# Patient Record
Sex: Female | Born: 1995 | Hispanic: Yes | Marital: Single | Smoking: Never smoker
Health system: Southern US, Community
[De-identification: ages and names within clinical notes are randomized; demographics above are authoritative.]

## PROBLEM LIST (undated history)

## (undated) ENCOUNTER — Inpatient Hospital Stay (HOSPITAL_COMMUNITY): Payer: Self-pay

## (undated) DIAGNOSIS — Z789 Other specified health status: Secondary | ICD-10-CM

## (undated) HISTORY — PX: NO PAST SURGERIES: SHX2092

---
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
## 2014-01-03 ENCOUNTER — Other Ambulatory Visit (HOSPITAL_COMMUNITY): Payer: Self-pay | Admitting: Nurse Practitioner

## 2014-01-03 DIAGNOSIS — Z3689 Encounter for other specified antenatal screening: Secondary | ICD-10-CM

## 2014-01-10 ENCOUNTER — Inpatient Hospital Stay (HOSPITAL_COMMUNITY)
Admission: AD | Admit: 2014-01-10 | Discharge: 2014-01-10 | Disposition: A | Discharge status: Home / Self Care | Payer: Medicaid Other | Source: Ambulatory Visit | Attending: Obstetrics & Gynecology | Admitting: Obstetrics & Gynecology

## 2014-01-10 ENCOUNTER — Encounter (HOSPITAL_COMMUNITY): Payer: Self-pay | Admitting: *Deleted

## 2014-01-10 DIAGNOSIS — R51 Headache: Secondary | ICD-10-CM | POA: Insufficient documentation

## 2014-01-10 DIAGNOSIS — O210 Mild hyperemesis gravidarum: Secondary | ICD-10-CM | POA: Insufficient documentation

## 2014-01-10 DIAGNOSIS — R109 Unspecified abdominal pain: Secondary | ICD-10-CM | POA: Insufficient documentation

## 2014-01-10 DIAGNOSIS — O219 Vomiting of pregnancy, unspecified: Secondary | ICD-10-CM

## 2014-01-10 HISTORY — DX: Other specified health status: Z78.9

## 2014-01-10 MED ORDER — PROMETHAZINE HCL 25 MG/ML IJ SOLN
25.0000 mg | Freq: Once | INTRAMUSCULAR | Status: AC
  Administered 2014-01-10: 25 mg via INTRAVENOUS
  Filled 2014-01-10: qty 1

## 2014-01-10 MED ORDER — PROMETHAZINE HCL 25 MG PO TABS: 25.0000 mg | ORAL_TABLET | Freq: Four times a day (QID) | ORAL | Status: DC | PRN

## 2014-01-10 NOTE — MAU Note (Signed)
C/O nausea, vomiting x 2 days.  Unable to keep anything down.  [facility]

## 2014-01-10 NOTE — MAU Provider Note (Signed)
History     CSN: [id number]  Arrival date and time: 01/10/14 1702   First Provider Initiated Contact with Patient 01/10/14 1833      Chief Complaint  Patient presents with  . Nausea  . Emesis During Pregnancy   HPI Ms. Sherri Kane is a 18 y.o. G2P1001 at 3429w3d who presents to MAU today with complaint of N/V. The patient states that she has had N/V throughout the pregnancy and has been taking B6 and Unisom. She states that N/V has become worse x 3 days. She has had little PO intake x 2 days. She has headache. She denies fever, diarrhea, UTI symptoms or vaginal bleeding. She does have some mild mid abdominal pain.   OB History   Grav Para Term Preterm Abortions TAB SAB Ect Mult Living   2 1 1       1       Past Medical History  Diagnosis Date  . Medical history non-contributory     Past Surgical History  Procedure Laterality Date  . No past surgeries      History reviewed. No pertinent family history.  History  Substance Use Topics  . Smoking status: Never Smoker   . Smokeless tobacco: Not on file  . Alcohol Use: No    Allergies: No Known Allergies  Prescriptions prior to admission  Medication Sig Dispense Refill  . doxylamine, Sleep, (UNISOM) 25 MG tablet Take 25 mg by mouth at bedtime as needed (nausea).      . Prenatal Vit-Fe Fumarate-FA (PRENATAL MULTIVITAMIN) TABS tablet Take 1 tablet by mouth daily at 12 noon.      . Pyridoxine HCl (B-6 PO) Take 1 tablet by mouth daily.        Review of Systems  Constitutional: Negative for fever and malaise/fatigue.  Gastrointestinal: Positive for nausea, vomiting and abdominal pain. Negative for diarrhea and constipation.  Genitourinary: Negative for dysuria, urgency and frequency.       Neg - vaginal bleeding  Neurological: Positive for headaches.   Physical Exam   Blood pressure 106/66, pulse 79, temperature 98.4 F (36.9 C), temperature source Oral, resp. rate 18, height 4\' 8"  (1.422 m), weight 136 lb  6.4 oz (61.871 kg), last menstrual period 10/08/2013.  Physical Exam  Constitutional: She is oriented to person, place, and time. She appears well-developed and well-nourished. No distress.  HENT:  Head: Normocephalic and atraumatic.  Cardiovascular: Normal rate, regular rhythm and normal heart sounds.   Respiratory: Effort normal and breath sounds normal. No respiratory distress.  GI: Soft. Bowel sounds are normal. She exhibits no distension and no mass. There is no tenderness. There is no rebound and no guarding.  Neurological: She is alert and oriented to person, place, and time.  Skin: Skin is warm and dry. No erythema.  Psychiatric: She has a normal mood and affect.    MAU Course  Procedures None  MDM FHR - 154 bpm with doppler Unable to given urine sample today 25 mg IV Phenergan in 1 liter D5LR ordered  Patient reports improvement in symptoms after IVF. No episodes of emesis while in MAU. Able to tolerate PO today.   Assessment and Plan  A: Nausea and vomiting in pregnancy prior to [redacted] week gestation  P: Discharge home Rx for Phenergan sent to patient's pharmacy Discussed increased PO hydration and advancing diet as tolerated Patient advised [facility] Patient may return to MAU as needed or if her  condition were to change or worsen   Freddi Starr, PA-C  01/10/2014, 8:31 PM

## 2014-01-10 NOTE — Discharge Instructions (Signed)
Hyperemesis Gravidarum Diet °Hyperemesis gravidarum is a severe form of morning sickness. It is characterized by frequent and severe vomiting. It happens during the first trimester of pregnancy. It may be caused by the rapid hormone changes that happen during pregnancy. It is associated with a 5% weight loss of pre-pregnancy weight. The hyperemesis diet may be used to lessen symptoms of nausea and vomiting. °EATING GUIDELINES °· Eat 5 to 6 small meals daily instead of 3 large meals. °· Avoid foods with strong smells. °· Avoid drinking 30 minutes before and after meals. °· Avoid fried or high-fat foods, such as butter and cream sauces. °· Starchy foods are usually well-tolerated, such as cereal, toast, bread, potatoes, pasta, rice, and pretzels. °· Eat crackers before you get out of bed in the morning. °· Avoid spicy foods. °· Ginger may help with nausea. Add ¼ tsp ginger to hot tea or choose ginger tea. °· Continue to take your prenatal vitamins as directed by your caregiver. °SAMPLE MEAL PLAN °Breakfast  °· ½ cup oatmeal °· 1 slice toast °· 1 tsp heart-healthy margarine °· 1 tsp jelly °· 1 scrambled egg °Midmorning Snack  °· 1 cup low-fat yogurt °Lunch  °· Plain ham sandwich °· Carrot or celery sticks °· 1 small apple °· 3 graham crackers °Midafternoon Snack  °· Cheese and crackers °Dinner °· 4 oz pork tenderloin °· 1 small baked potato °· 1 tsp margarine °· ½ cup broccoli °· ½ cup grapes °Evening Snack °· 1 cup pudding °Document Released: 07/28/2007 Document Revised: 12/23/2011 Document Reviewed: 03/02/2013 °ExitCare® Patient Information ©2014 [employer]. ° °Morning Sickness °Morning sickness is when you feel sick to your stomach (nauseous) during pregnancy. You may feel sick to your stomach and throw up (vomit). You may feel sick in the morning, but you can feel this way any time of day. Some women feel very sick to their stomach and cannot stop throwing up (hyperemesis gravidarum). °HOME CARE °· Only take  medicines as told by your doctor. °· Take multivitamins as told by your doctor. Taking multivitamins before getting pregnant can stop or lessen the harshness of morning sickness. °· Eat dry toast or unsalted crackers before getting out of bed. °· Eat 5 to 6 small meals a day. °· Eat dry and bland foods like rice and baked potatoes. °· Do not drink liquids with meals. Drink between meals. °· Do not eat greasy, fatty, or spicy foods. °· Have someone cook for you if the smell of food causes you to feel sick or throw up. °· If you feel sick to your stomach after taking prenatal vitamins, take them at night or with a snack. °· Eat protein when you need a snack (nuts, yogurt, cheese). °· Eat unsweetened gelatins for dessert. °· Wear a bracelet used for sea sickness (acupressure wristband). °· Go to a doctor that puts thin needles into certain body points (acupuncture) to improve how you feel. °· Do not smoke. °· Use a humidifier to keep the air in your house free of odors. °· Get lots of fresh air. °GET HELP IF: °· You need medicine to feel better. °· You feel dizzy or lightheaded. °· You are losing weight. °GET HELP RIGHT AWAY IF:  °· You feel very sick to your stomach and cannot stop throwing up. °· You pass out (faint). °Document Released: 11/07/2004 Document Revised: 06/02/2013 Document Reviewed: 03/17/2013 °ExitCare® Patient Information ©2014 [employer]. ° °

## 2014-01-11 ENCOUNTER — Encounter (HOSPITAL_COMMUNITY): Payer: Self-pay

## 2014-01-11 ENCOUNTER — Ambulatory Visit (HOSPITAL_COMMUNITY): Admission: RE | Admit: 2014-01-11 | Payer: Medicaid Other | Source: Ambulatory Visit

## 2014-01-11 ENCOUNTER — Other Ambulatory Visit (HOSPITAL_COMMUNITY): Payer: Self-pay | Admitting: Nurse Practitioner

## 2014-01-11 ENCOUNTER — Ambulatory Visit (HOSPITAL_COMMUNITY)
Admission: RE | Admit: 2014-01-11 | Discharge: 2014-01-11 | Disposition: A | Discharge status: Home / Self Care | Payer: Medicaid Other | Source: Ambulatory Visit | Attending: Nurse Practitioner | Admitting: Nurse Practitioner

## 2014-01-11 DIAGNOSIS — Z3682 Encounter for antenatal screening for nuchal translucency: Secondary | ICD-10-CM

## 2014-01-11 DIAGNOSIS — O351XX0 Maternal care for (suspected) chromosomal abnormality in fetus, not applicable or unspecified: Secondary | ICD-10-CM | POA: Insufficient documentation

## 2014-01-11 DIAGNOSIS — Z3689 Encounter for other specified antenatal screening: Secondary | ICD-10-CM | POA: Insufficient documentation

## 2014-01-11 DIAGNOSIS — O3510X0 Maternal care for (suspected) chromosomal abnormality in fetus, unspecified, not applicable or unspecified: Secondary | ICD-10-CM | POA: Insufficient documentation

## 2014-02-21 ENCOUNTER — Ambulatory Visit (HOSPITAL_COMMUNITY)
Admission: RE | Admit: 2014-02-21 | Discharge: 2014-02-21 | Disposition: A | Discharge status: Home / Self Care | Payer: Medicaid Other | Source: Ambulatory Visit | Attending: Nurse Practitioner | Admitting: Nurse Practitioner

## 2014-02-21 DIAGNOSIS — Z3689 Encounter for other specified antenatal screening: Secondary | ICD-10-CM | POA: Insufficient documentation

## 2014-04-22 ENCOUNTER — Other Ambulatory Visit (HOSPITAL_COMMUNITY): Payer: Self-pay | Admitting: Nurse Practitioner

## 2014-04-22 DIAGNOSIS — Z0489 Encounter for examination and observation for other specified reasons: Secondary | ICD-10-CM

## 2014-04-22 DIAGNOSIS — IMO0002 Reserved for concepts with insufficient information to code with codable children: Secondary | ICD-10-CM

## 2014-04-29 ENCOUNTER — Ambulatory Visit (HOSPITAL_COMMUNITY)
Admission: RE | Admit: 2014-04-29 | Discharge: 2014-04-29 | Disposition: A | Discharge status: Home / Self Care | Payer: Medicaid Other | Source: Ambulatory Visit | Attending: Nurse Practitioner | Admitting: Nurse Practitioner

## 2014-04-29 DIAGNOSIS — Z363 Encounter for antenatal screening for malformations: Secondary | ICD-10-CM | POA: Insufficient documentation

## 2014-04-29 DIAGNOSIS — IMO0002 Reserved for concepts with insufficient information to code with codable children: Secondary | ICD-10-CM

## 2014-04-29 DIAGNOSIS — Z0489 Encounter for examination and observation for other specified reasons: Secondary | ICD-10-CM

## 2014-04-29 DIAGNOSIS — Z1389 Encounter for screening for other disorder: Secondary | ICD-10-CM | POA: Diagnosis not present

## 2014-06-08 ENCOUNTER — Encounter (HOSPITAL_COMMUNITY): Payer: Self-pay

## 2014-06-08 ENCOUNTER — Inpatient Hospital Stay (HOSPITAL_COMMUNITY)
Admission: AD | Admit: 2014-06-08 | Discharge: 2014-06-08 | Disposition: A | Discharge status: Home / Self Care | Payer: Medicaid Other | Source: Ambulatory Visit | Attending: Obstetrics and Gynecology | Admitting: Obstetrics and Gynecology

## 2014-06-08 ENCOUNTER — Other Ambulatory Visit: Payer: Self-pay | Admitting: Family Medicine

## 2014-06-08 DIAGNOSIS — O4700 False labor before 37 completed weeks of gestation, unspecified trimester: Secondary | ICD-10-CM | POA: Insufficient documentation

## 2014-06-08 DIAGNOSIS — O479 False labor, unspecified: Secondary | ICD-10-CM

## 2014-06-08 DIAGNOSIS — N3001 Acute cystitis with hematuria: Secondary | ICD-10-CM

## 2014-06-08 DIAGNOSIS — M549 Dorsalgia, unspecified: Secondary | ICD-10-CM | POA: Diagnosis present

## 2014-06-08 DIAGNOSIS — N390 Urinary tract infection, site not specified: Secondary | ICD-10-CM

## 2014-06-08 LAB — URINALYSIS, ROUTINE W REFLEX MICROSCOPIC
Bilirubin Urine: NEGATIVE
GLUCOSE, UA: NEGATIVE mg/dL
KETONES UR: NEGATIVE mg/dL
NITRITE: NEGATIVE
PH: 7 (ref 5.0–8.0)
Protein, ur: NEGATIVE mg/dL
SPECIFIC GRAVITY, URINE: 1.01 (ref 1.005–1.030)
Urobilinogen, UA: 0.2 mg/dL (ref 0.0–1.0)

## 2014-06-08 LAB — URINE MICROSCOPIC-ADD ON

## 2014-06-08 MED ORDER — NITROFURANTOIN MONOHYD MACRO 100 MG PO CAPS: 100.0000 mg | ORAL_CAPSULE | Freq: Three times a day (TID) | ORAL | Status: DC

## 2014-06-08 NOTE — MAU Note (Signed)
Patient reports she is feeling lower back pain and stomach tightness, as well as pressure in her bladder area.

## 2014-06-08 NOTE — MAU Provider Note (Signed)
Chief Complaint:  Back Pain   None     HPI: Sherri Kane is a 18 y.o. G2P1001 at [redacted]w[redacted]d pt of [facility] who presents to maternity admissions reporting tightening of her abdomen every 10 minutes accompanied with back pain.  She reports feeling some urgency to urinate after she has already emptied her bladder but denies pain with urination.  She reports good fetal movement, denies LOF, vaginal bleeding, vaginal itching/burning, h/a, dizziness, n/v, or fever/chills.    Past Medical History: Past Medical History  Diagnosis Date  . Medical history non-contributory     Past obstetric history: OB History  Gravida Para Term Preterm AB SAB TAB Ectopic Multiple Living  # Outcome Date GA Lbr Len/2nd Weight Sex Delivery Anes PTL Lv  2 CUR           1 TRM               Past Surgical History: Past Surgical History  Procedure Laterality Date  . No past surgeries      Family History: History reviewed. No pertinent family history.  Social History: History  Substance Use Topics  . Smoking status: Never Smoker   . Smokeless tobacco: Not on file  . Alcohol Use: No    Allergies: No Known Allergies  Meds:  Prescriptions prior to admission  Medication Sig Dispense Refill  . Prenatal Vit-Fe Fumarate-FA (PRENATAL MULTIVITAMIN) TABS tablet Take 1 tablet by mouth daily at 12 noon.        ROS: Pertinent findings in history of present illness.  Physical Exam  Blood pressure 100/67, pulse 90, temperature 98.1 F (36.7 C), height 4' 8.5" (1.435 m), weight 70.852 kg (156 lb 3.2 oz), last menstrual period 10/08/2013. GENERAL: Well-developed, well-nourished female in no acute distress.  HEENT: normocephalic HEART: normal rate RESP: normal effort ABDOMEN: Soft, non-tender, gravid appropriate for gestational age EXTREMITIES: Nontender, no edema NEURO: alert and oriented  Dilation: 1 Effacement (%): Thick Cervical Position: Posterior Station:  Ballotable Presentation: Undeterminable Exam by:: Sharen Counter, CNM  FHT:  Baseline 135, moderate variability, accelerations present, no decelerations Contractions: irregular, q5-10 minutes, mild to palpation   Labs: Results for orders placed during the hospital encounter of 06/08/14 (from the past 24 hour(s))  URINALYSIS, ROUTINE W REFLEX MICROSCOPIC     Status: Abnormal   Collection Time    06/08/14  7:15 PM      Result Value Ref Range   Color, Urine YELLOW  YELLOW   APPearance CLEAR  CLEAR   Specific Gravity, Urine 1.010  1.005 - 1.030   pH 7.0  5.0 - 8.0   Glucose, UA NEGATIVE  NEGATIVE mg/dL   Hgb urine dipstick TRACE (*) NEGATIVE   Bilirubin Urine NEGATIVE  NEGATIVE   Ketones, ur NEGATIVE  NEGATIVE mg/dL   Protein, ur NEGATIVE  NEGATIVE mg/dL   Urobilinogen, UA 0.2  0.0 - 1.0 mg/dL   Nitrite NEGATIVE  NEGATIVE   Leukocytes, UA MODERATE (*) NEGATIVE  URINE MICROSCOPIC-ADD ON     Status: Abnormal   Collection Time    06/08/14  7:15 PM      Result Value Ref Range   Squamous Epithelial / LPF FEW (*) RARE   WBC, UA 11-20  <3 WBC/hpf   Bacteria, UA FEW (*) RARE   Urine sent for culture  Report to Dr Loreta Ave at 8:45 pm  Sharen Counter Certified Nurse-Midwife 06/08/2014 8:57 PM  OB fellow 9:34 PM  Sherri Kane is a 18 y.o. G2P1001 reporting intermittent contractions, symptoms essentially unchanged since presentation +FM, denies LOF, VB, vaginal discharge.  PE: BP 97/58  Pulse 68  Temp(Src) 98.1 F (36.7 C)  Ht 4' 8.5" (1.435 m)  Wt 156 lb 3.2 oz (70.852 kg)  BMI 34.41 kg/m2  LMP 10/08/2013 Gen: calm comfortable, NAD Resp: normal effort, no distress Abd: gravid  ROS, labs, PMH reviewed NST reactive  Plan: - fetal kick counts reinforced, preterm labor precautions - rx macrobid, urine sent for culture - continue routine follow up in OB clinic  Perry Mount, MD 9:33 PM

## 2014-06-08 NOTE — Discharge Instructions (Signed)
Preterm Labor Information Preterm labor is when labor starts before you are [redacted] weeks pregnant. The normal length of pregnancy is 39 to 41 weeks.  CAUSES  The cause of preterm labor is not often known. The most common known cause is infection. RISK FACTORS  Having a history of preterm labor.  Having your water break before it should.  Having a placenta that covers the opening of the cervix.  Having a placenta that breaks away from the uterus.  Having a cervix that is too weak to hold the baby in the uterus.  Having too much fluid in the amniotic sac.  Taking drugs or smoking while pregnant.  Not gaining enough weight while pregnant.  Being younger than 18 and older than 19 years old.  Having a low income.  Being African American. SYMPTOMS  Period-like cramps, belly (abdominal) pain, or back pain.  Contractions that are regular, as often as six in an hour. They may be mild or painful.  Contractions that start at the top of the belly. They then move to the lower belly and back.  Lower belly pressure that seems to get stronger.  Bleeding from the vagina.  Fluid leaking from the vagina. TREATMENT  Treatment depends on:  Your condition.  The condition of your baby.  How many weeks pregnant you are. Your doctor may have you:  Take medicine to stop contractions.  Stay in bed except to use the restroom (bed rest).  Stay in the hospital. WHAT SHOULD YOU DO IF YOU THINK YOU ARE IN PRETERM LABOR? Call your doctor right away. You need to go to the hospital right away.  HOW CAN YOU PREVENT PRETERM LABOR IN FUTURE PREGNANCIES?  Stop smoking, if you smoke.  Maintain healthy weight gain.  Do not take drugs or be around chemicals that are not needed.  Tell your doctor if you think you have an infection.  Tell your doctor if you had a preterm labor before. Document Released: 12/27/2008 Document Revised: 07/21/2013 Document Reviewed: 12/27/2008 ExitCare Patient  Information 2015 [employer]. This information is not intended to replace advice given to you by your health care provider. Make sure you discuss any questions you have with your health care provider.  

## 2014-06-10 LAB — URINE CULTURE
Colony Count: NO GROWTH
Culture: NO GROWTH

## 2014-06-10 NOTE — MAU Provider Note (Signed)
Attestation of Attending Supervision of Advanced Practitioner: Evaluation and management procedures were performed by the PA/NP/CNM/OB Fellow under my supervision/collaboration. Chart reviewed and agree with management and plan.  Shoni Quijas V 06/10/2014 2:22 PM   

## 2014-06-17 LAB — OB RESULTS CONSOLE GBS: GBS: NEGATIVE

## 2014-07-03 ENCOUNTER — Inpatient Hospital Stay (HOSPITAL_COMMUNITY): Payer: Medicaid Other | Admitting: Anesthesiology

## 2014-07-03 ENCOUNTER — Encounter (HOSPITAL_COMMUNITY): Payer: Medicaid Other | Admitting: Anesthesiology

## 2014-07-03 ENCOUNTER — Encounter (HOSPITAL_COMMUNITY): Payer: Self-pay | Admitting: *Deleted

## 2014-07-03 ENCOUNTER — Inpatient Hospital Stay (HOSPITAL_COMMUNITY)
Admission: AD | Admit: 2014-07-03 | Discharge: 2014-07-05 | DRG: 775 | Disposition: A | Discharge status: Home / Self Care | Payer: Medicaid Other | Source: Ambulatory Visit | Attending: Obstetrics and Gynecology | Admitting: Obstetrics and Gynecology

## 2014-07-03 DIAGNOSIS — O479 False labor, unspecified: Secondary | ICD-10-CM | POA: Diagnosis present

## 2014-07-03 DIAGNOSIS — Z3490 Encounter for supervision of normal pregnancy, unspecified, unspecified trimester: Secondary | ICD-10-CM

## 2014-07-03 LAB — TYPE AND SCREEN
ABO/RH(D): A POS
Antibody Screen: NEGATIVE

## 2014-07-03 LAB — CBC
HCT: 36.1 % (ref 36.0–46.0)
Hemoglobin: 12 g/dL (ref 12.0–15.0)
MCH: 27 pg (ref 26.0–34.0)
MCHC: 33.2 g/dL (ref 30.0–36.0)
MCV: 81.3 fL (ref 78.0–100.0)
Platelets: 223 10*3/uL (ref 150–400)
RBC: 4.44 MIL/uL (ref 3.87–5.11)
RDW: 14.8 % (ref 11.5–15.5)
WBC: 10.2 10*3/uL (ref 4.0–10.5)

## 2014-07-03 LAB — RPR

## 2014-07-03 LAB — ABO/RH: ABO/RH(D): A POS

## 2014-07-03 MED ORDER — CITRIC ACID-SODIUM CITRATE 334-500 MG/5ML PO SOLN: 30.0000 mL | ORAL | Status: DC | PRN

## 2014-07-03 MED ORDER — SIMETHICONE 80 MG PO CHEW: 80.0000 mg | CHEWABLE_TABLET | ORAL | Status: DC | PRN

## 2014-07-03 MED ORDER — LACTATED RINGERS IV SOLN
500.0000 mL | INTRAVENOUS | Status: DC | PRN
  Administered 2014-07-03: 500 mL via INTRAVENOUS

## 2014-07-03 MED ORDER — LIDOCAINE HCL (PF) 1 % IJ SOLN
30.0000 mL | INTRAMUSCULAR | Status: DC | PRN
  Filled 2014-07-03: qty 30

## 2014-07-03 MED ORDER — OXYTOCIN BOLUS FROM INFUSION: 500.0000 mL | INTRAVENOUS | Status: DC

## 2014-07-03 MED ORDER — OXYCODONE-ACETAMINOPHEN 5-325 MG PO TABS: 2.0000 | ORAL_TABLET | ORAL | Status: DC | PRN

## 2014-07-03 MED ORDER — FLEET ENEMA 7-19 GM/118ML RE ENEM: 1.0000 | ENEMA | RECTAL | Status: DC | PRN

## 2014-07-03 MED ORDER — LANOLIN HYDROUS EX OINT: TOPICAL_OINTMENT | CUTANEOUS | Status: DC | PRN

## 2014-07-03 MED ORDER — OXYCODONE-ACETAMINOPHEN 5-325 MG PO TABS
1.0000 | ORAL_TABLET | ORAL | Status: DC | PRN
  Administered 2014-07-03: 1 via ORAL
  Filled 2014-07-03: qty 1

## 2014-07-03 MED ORDER — ZOLPIDEM TARTRATE 5 MG PO TABS: 5.0000 mg | ORAL_TABLET | Freq: Every evening | ORAL | Status: DC | PRN

## 2014-07-03 MED ORDER — SENNOSIDES-DOCUSATE SODIUM 8.6-50 MG PO TABS
2.0000 | ORAL_TABLET | ORAL | Status: DC
  Administered 2014-07-04 – 2014-07-05 (×2): 2 via ORAL
  Filled 2014-07-03 (×2): qty 2

## 2014-07-03 MED ORDER — TETANUS-DIPHTH-ACELL PERTUSSIS 5-2.5-18.5 LF-MCG/0.5 IM SUSP: 0.5000 mL | Freq: Once | INTRAMUSCULAR | Status: DC

## 2014-07-03 MED ORDER — DIPHENHYDRAMINE HCL 50 MG/ML IJ SOLN: 12.5000 mg | INTRAMUSCULAR | Status: DC | PRN

## 2014-07-03 MED ORDER — FENTANYL CITRATE 0.05 MG/ML IJ SOLN
100.0000 ug | INTRAMUSCULAR | Status: DC | PRN
  Administered 2014-07-03: 100 ug via INTRAVENOUS
  Filled 2014-07-03: qty 2

## 2014-07-03 MED ORDER — LACTATED RINGERS IV SOLN
INTRAVENOUS | Status: DC
Start: 2014-07-03 — End: 2014-07-03
  Administered 2014-07-03: 10:00:00 via INTRAVENOUS

## 2014-07-03 MED ORDER — IBUPROFEN 600 MG PO TABS
600.0000 mg | ORAL_TABLET | Freq: Four times a day (QID) | ORAL | Status: DC
  Administered 2014-07-03 – 2014-07-05 (×8): 600 mg via ORAL
  Filled 2014-07-03 (×8): qty 1

## 2014-07-03 MED ORDER — FENTANYL 2.5 MCG/ML BUPIVACAINE 1/10 % EPIDURAL INFUSION (WH - ANES)
INTRAMUSCULAR | Status: AC
  Filled 2014-07-03: qty 125

## 2014-07-03 MED ORDER — PHENYLEPHRINE 40 MCG/ML (10ML) SYRINGE FOR IV PUSH (FOR BLOOD PRESSURE SUPPORT)
PREFILLED_SYRINGE | INTRAVENOUS | Status: AC
  Filled 2014-07-03: qty 10

## 2014-07-03 MED ORDER — ACETAMINOPHEN 325 MG PO TABS: 650.0000 mg | ORAL_TABLET | ORAL | Status: DC | PRN

## 2014-07-03 MED ORDER — PHENYLEPHRINE 40 MCG/ML (10ML) SYRINGE FOR IV PUSH (FOR BLOOD PRESSURE SUPPORT)
80.0000 ug | PREFILLED_SYRINGE | INTRAVENOUS | Status: DC | PRN
  Filled 2014-07-03: qty 2

## 2014-07-03 MED ORDER — FENTANYL 2.5 MCG/ML BUPIVACAINE 1/10 % EPIDURAL INFUSION (WH - ANES)
14.0000 mL/h | INTRAMUSCULAR | Status: DC | PRN
  Administered 2014-07-03: 14 mL/h via EPIDURAL
  Administered 2014-07-03: 12 mL/h via EPIDURAL

## 2014-07-03 MED ORDER — LIDOCAINE HCL (PF) 1 % IJ SOLN
INTRAMUSCULAR | Status: DC | PRN
  Administered 2014-07-03: 8 mL

## 2014-07-03 MED ORDER — EPHEDRINE 5 MG/ML INJ
10.0000 mg | INTRAVENOUS | Status: DC | PRN
  Filled 2014-07-03: qty 2

## 2014-07-03 MED ORDER — INFLUENZA VAC SPLIT QUAD 0.5 ML IM SUSY
0.5000 mL | PREFILLED_SYRINGE | INTRAMUSCULAR | Status: AC
  Administered 2014-07-04: 0.5 mL via INTRAMUSCULAR
  Filled 2014-07-03: qty 0.5

## 2014-07-03 MED ORDER — LACTATED RINGERS IV SOLN: 500.0000 mL | Freq: Once | INTRAVENOUS | Status: DC

## 2014-07-03 MED ORDER — ONDANSETRON HCL 4 MG PO TABS: 4.0000 mg | ORAL_TABLET | ORAL | Status: DC | PRN

## 2014-07-03 MED ORDER — DIBUCAINE 1 % RE OINT: 1.0000 "application " | TOPICAL_OINTMENT | RECTAL | Status: DC | PRN

## 2014-07-03 MED ORDER — PRENATAL MULTIVITAMIN CH
1.0000 | ORAL_TABLET | Freq: Every day | ORAL | Status: DC
  Administered 2014-07-04 – 2014-07-05 (×2): 1 via ORAL
  Filled 2014-07-03 (×2): qty 1

## 2014-07-03 MED ORDER — WITCH HAZEL-GLYCERIN EX PADS: 1.0000 "application " | MEDICATED_PAD | CUTANEOUS | Status: DC | PRN

## 2014-07-03 MED ORDER — OXYTOCIN 40 UNITS IN LACTATED RINGERS INFUSION - SIMPLE MED
62.5000 mL/h | INTRAVENOUS | Status: DC
Start: 2014-07-03 — End: 2014-07-03
  Administered 2014-07-03: 500 mL/h via INTRAVENOUS
  Filled 2014-07-03: qty 1000

## 2014-07-03 MED ORDER — OXYCODONE-ACETAMINOPHEN 5-325 MG PO TABS: 1.0000 | ORAL_TABLET | ORAL | Status: DC | PRN

## 2014-07-03 MED ORDER — DIPHENHYDRAMINE HCL 25 MG PO CAPS: 25.0000 mg | ORAL_CAPSULE | Freq: Four times a day (QID) | ORAL | Status: DC | PRN

## 2014-07-03 MED ORDER — BENZOCAINE-MENTHOL 20-0.5 % EX AERO: 1.0000 "application " | INHALATION_SPRAY | CUTANEOUS | Status: DC | PRN

## 2014-07-03 MED ORDER — ONDANSETRON HCL 4 MG/2ML IJ SOLN: 4.0000 mg | Freq: Four times a day (QID) | INTRAMUSCULAR | Status: DC | PRN

## 2014-07-03 MED ORDER — FENTANYL 2.5 MCG/ML BUPIVACAINE 1/10 % EPIDURAL INFUSION (WH - ANES): 14.0000 mL/h | INTRAMUSCULAR | Status: DC | PRN

## 2014-07-03 MED ORDER — ONDANSETRON HCL 4 MG/2ML IJ SOLN: 4.0000 mg | INTRAMUSCULAR | Status: DC | PRN

## 2014-07-03 NOTE — MAU Note (Signed)
Pt presents to MAU with complaints of contractions that started around 830 this morning. Denies any LOF, reports some spotting

## 2014-07-03 NOTE — H&P (Signed)
Sherri Kane is a 18 y.o. female G2P1001 with IUP at [redacted]w[redacted]d presenting for SOL. Contractions started at 8:30am and have become more frequent.  Contractions every 2 minutes now. Note spotting at 9am: small amount of pinkish material. No large gush of fluid. Good fetal movement Sherri Kane not have a name yet  [facility] 07/15/14 by LMP, c/w 13.4wk U/S  Prenatal History/Complications: None  Past Medical History: Past Medical History  Diagnosis Date  . Medical history non-contributory     Past Surgical History: Past Surgical History  Procedure Laterality Date  . No past surgeries      Obstetrical History: OB History   Grav Para Term Preterm Abortions TAB SAB Ect Mult Living   Social History: History   Social History  . Marital Status: Single    Spouse Name: N/A    Number of Children: N/A  . Years of Education: N/A   Social History Main Topics  . Smoking status: Never Smoker   . Smokeless tobacco: Never Used  . Alcohol Use: No  . Drug Use: No  . Sexual Activity: Yes    Birth Control/ Protection: None   Other Topics Concern  . None   Social History Narrative  . None    Family History: History reviewed. No pertinent family history.  Allergies: No Known Allergies  Prescriptions prior to admission  Medication Sig Dispense Refill  . diphenhydrAMINE (BENADRYL) 25 MG tablet Take 50 mg by mouth every 6 (six) hours as needed for allergies.      . Prenatal Vit-Fe Fumarate-FA (PRENATAL MULTIVITAMIN) TABS tablet Take 1 tablet by mouth daily at 12 noon.      . nitrofurantoin, macrocrystal-monohydrate, (MACROBID) 100 MG capsule Take 1 capsule (100 mg total) by mouth 3 (three) times daily.  21 capsule  0     Review of Systems   Constitutional: No fever, chills, H/A, blurred vision, fatigue, increased swelling  Blood pressure 116/64, pulse 99, temperature 98.2 F (36.8 C), temperature source Oral, resp. rate  18, height  (1.499 m), weight 71.668 kg (158 lb), last menstrual period 10/08/2013. General appearance: alert, cooperative and no distress Lungs: clear to auscultation bilaterally Heart: regular rate and rhythm Abdomen: soft, non-tender; bowel sounds normal Pelvic: adequate Extremities: Homans sign is negative, no sign of DVT DTR's 2+ Presentation: cephalic Fetal monitoringBaseline: 135 bpm, Variability: Good {> 6 bpm), Accelerations: Reactive and Decelerations: Absent Uterine activityFrequency: Every 2-3 minutes Dilation: 4.5 Effacement (%): 80 Station: -1;-2 Exam by:: Sherri Burns, RN BSN   Prenatal labs: ABO, Rh:  A pos Antibody:  Neg Rubella:  immune RPR:   neg HBsAg:   neg HIV:   neg GBS:   neg 1 hr Glucola 96>>93 Genetic screening  normal Anatomy US normal 29.0wk U/S: EFW 2lb 13oz, 47%   Prenatal Transfer Tool  Maternal Diabetes: No Genetic Screening: Normal Maternal Ultrasounds/Referrals: Normal Fetal Ultrasounds or other Referrals:  None Maternal Substance Abuse:  No Significant Maternal Medications:  None Significant Maternal Lab Results: Lab values include: Group B Strep negative    Assessment: Sherri Kane is a 18 y.o. G2P1001 at [redacted]w[redacted]d by LMP, c/w 13.4wk U/S presenting in active labor #Labor: Admit to birthing suits #Pain: Fentanyl PRN, epidural upon request #FWB: Cat 1 #ID:  GBS neg #MOF: Breast/bottle #MOC: IUD #Circ:  No  Sherri Kane, Sherri Kane S 07/03/2014, 10:10 AM

## 2014-07-03 NOTE — Anesthesia Procedure Notes (Signed)
Epidural Patient location during procedure: OB  Preanesthetic Checklist Completed: patient identified, site marked, surgical consent, pre-op evaluation, timeout performed, IV checked, risks and benefits discussed and monitors and equipment checked  Epidural Patient position: sitting Prep: site prepped and draped and DuraPrep Patient monitoring: continuous pulse ox and blood pressure Approach: midline Injection technique: LOR air  Needle:  Needle type: Tuohy  Needle gauge: 17 G Needle length: 9 cm and 9 Needle insertion depth: 7 cm Catheter type: closed end flexible Catheter size: 19 Gauge Catheter at skin depth: 14 cm Test dose: negative  Assessment Events: blood not aspirated, injection not painful, no injection resistance, negative IV test and no paresthesia  Additional Notes Dosing of Epidural:  1st dose, through catheter ............................................. Xylocaine 30 mg  2nd dose, through catheter, after waiting 3 minutes........Xylocaine 30 mg   ( 1% Xylo charted as a single dose in Epic Meds for ease of charting; actual dosing was fractionated as above, for saftey's sake)  As each dose occurred, patient was free of IV sx; and patient exhibited no evidence of SA injection.  Patient is more comfortable after epidural dosed. Please see RN's note for documentation of vital signs,and FHR which are stable.  Patient reminded not to try to ambulate with numb legs, and that an RN must be present the 1st time she attempts to get up.    

## 2014-07-03 NOTE — Anesthesia Preprocedure Evaluation (Signed)

## 2014-07-04 NOTE — Lactation Note (Signed)
This note was copied from the chart of Sherri Kane. Lactation Consultation Note  P2, Reviewed how to hand express.  Drops of colostrum expressed. Assisted mother in cross cradle hold although mother prefers cradle. Discussed how to achieve a deeper latch, cluster feeding, stomach size and supply and demand. Mom encouraged to feed baby 8-12 times/24 hours and with feeding cues.  Mom made aware of O/P services, breastfeeding support groups, community resources, and our phone # for post-discharge questions.    Patient Name: Sherri Kimley Apsey Today's Date: 07/04/2014 Reason for consult: Initial assessment   Maternal Data Has patient been taught Hand Expression?: Yes Does the patient have breastfeeding experience prior to this delivery?: Yes  Feeding Feeding Type: Breast Fed  LATCH Score/Interventions                      Lactation Tools Discussed/Used     Consult Status Consult Status: Follow-up Date: 07/05/14 Follow-up type: In-patient    Dahlia Byes [facility] 07/04/2014, 2:51 PM

## 2014-07-04 NOTE — Progress Notes (Signed)
Post Partum Day #1 Subjective:  Ty Oshima is a 18 y.o. G2P1001 [redacted]w[redacted]d s/p NSVD.  No acute events overnight.  Pt denies problems with ambulating, voiding or po intake.  She denies nausea or vomiting.  Pain is well controlled.  She has had flatus. She has had bowel movement.  Lochia Minimal.  Plan for birth control is IUD.  Method of Feeding: Breast  Objective: Blood pressure 92/49, pulse 72, temperature 97.6 F (36.4 C), temperature source Oral, resp. rate 18, height  (1.499 m), weight 158 lb (71.668 kg), last menstrual period 10/08/2013, SpO2 98.00%.  Physical Exam:  General: alert, cooperative and no distress Lochia:normal flow Chest: CTAB Heart: RRR no m/r/g Abdomen: +BS, soft, nontender,  Uterine Fundus: firm, below umbilicus DVT Evaluation: No evidence of DVT seen on physical exam. Extremities: Trace edema   Recent Labs  07/03/14 1016  HGB 12.0  HCT 36.1    Assessment/Plan:  ASSESSMENT: Mikenna Bunkley is a 19 y.o. [id number] [redacted]w[redacted]d s/p NSVD  Plan for discharge tomorrow, Breastfeeding and Contraception IUD   LOS: 1 day   Elita Boone 07/04/2014, 9:17 AM

## 2014-07-04 NOTE — Progress Notes (Signed)
Ur chart review completed.  

## 2014-07-04 NOTE — Anesthesia Postprocedure Evaluation (Signed)
  Anesthesia Post-op Note  Patient: Francheska Villeda  Procedure(s) Performed: * No procedures listed *  Patient Location: PACU and Mother/Baby  Anesthesia Type:Epidural  Level of Consciousness: awake, alert  and oriented  Airway and Oxygen Therapy: Patient Spontanous Breathing  Post-op Pain: mild  Post-op Assessment: Patient's Cardiovascular Status Stable, Respiratory Function Stable, No signs of Nausea or vomiting, Adequate PO intake, Pain level controlled, No headache, No backache, No residual numbness and No residual motor weakness  Post-op Vital Signs: Reviewed and stable  Last Vitals:  Filed Vitals:   07/04/14 0611  BP: 92/49  Pulse: 72  Temp: 36.4 C  Resp: 18    Complications: No apparent anesthesia complications

## 2014-07-05 ENCOUNTER — Encounter (HOSPITAL_COMMUNITY): Payer: Self-pay

## 2014-07-05 MED ORDER — IBUPROFEN 600 MG PO TABS: 600.0000 mg | ORAL_TABLET | Freq: Four times a day (QID) | ORAL | Status: AC | PRN

## 2014-07-05 NOTE — Discharge Instructions (Signed)

## 2014-07-05 NOTE — Discharge Summary (Signed)
Obstetric Discharge Summary Reason for Admission: onset of labor Prenatal Procedures: none Intrapartum Procedures: spontaneous vaginal delivery Postpartum Procedures: none Complications-Operative and Postpartum: 1st degree perineal laceration Hemoglobin  Date Value Ref Range Status  07/03/2014 12.0  12.0 - 15.0 g/dL Final     HCT  Date Value Ref Range Status  07/03/2014 36.1  36.0 - 46.0 % Final    Physical Exam:  General: alert, cooperative and no distress Lochia: appropriate Uterine Fundus: firm Incision: healing well DVT Evaluation: No evidence of DVT seen on physical exam. Negative Homan's sign.  Discharge Diagnoses: Term Pregnancy-delivered  Discharge Information: Date: 07/05/2014 Activity: pelvic rest Diet: routine Medications: PNV and Ibuprofen Condition: stable Instructions: refer to practice specific booklet Discharge to: home Follow-up Information   Follow up with [facility] In 5 weeks. (post partum appt)    Specialty:  Home Health Services   Contact information:   Care Coordination for [facility] [address] [place] [address] [phone]       Newborn Data: Live born female  Birth Weight: 6 lb 10.6 oz (3022 g) APGAR: 9, 9  Home with mother. IUD Breast  Kimmarie Pascale H. 07/05/2014, 10:48 AM

## 2014-07-05 NOTE — Lactation Note (Addendum)
This note was copied from the chart of Sherri Kane. Lactation Consultation Note  Patient Name: Sherri Kane [id number] Date: 07/05/2014 Reason for consult: Follow-up assessment Per mom the baby is latching , but my nipples are tender both sides ,  LC assessed breast tissue with moms permission. LC noted positional strips on the left nipple and the right on was pinky red. Both areolas edematous and semi compressible . LC instructed on breast feeding latching basics - breast massage, hand express, prepump  If need to make the nipple and areola more elastic , latch with breast compressions until the baby is in a consistent pattern and then intermittent. Sore nipple and engorgement prevention and tx needed. Instructed on shells , comfort gels, hand pump. Mother informed of post-discharge support and given phone number to the lactation department, including services for phone call assistance;  out-patient appointments; and breastfeeding support group. List of other breastfeeding resources in the community given in the handout.  Encouraged mother to call for problems or concerns related to breastfeeding.     Maternal Data Has patient been taught Hand Expression?: Yes  Feeding - earlier feeding  Feeding Type: Breast Fed Length of feed: 20 min  LATCH Score/Interventions Latch:  (No latch observed 2300-0700)              Intervention(s): Breastfeeding basics reviewed     Lactation Tools Discussed/Used Pump Review: Setup, frequency, and cleaning;Milk Storage Initiated by:: MAI  Date initiated:: 07/05/14   Consult Status Consult Status: Complete Date: 07/05/14    Kathrin Greathouse 07/05/2014, 10:08 AM

## 2014-07-07 ENCOUNTER — Encounter (HOSPITAL_COMMUNITY): Payer: Self-pay | Admitting: *Deleted

## 2014-08-15 ENCOUNTER — Encounter (HOSPITAL_COMMUNITY): Payer: Self-pay | Admitting: *Deleted

## 2015-06-06 IMAGING — US US OB COMP +14 WK
2 series · 12 of 28 positions shown · non-contrast
Comparison: none

[Series 1: us ob comp +14 wk · 96 acquisitions, 9 frames shown (1 of 2)]
[im 5/96]
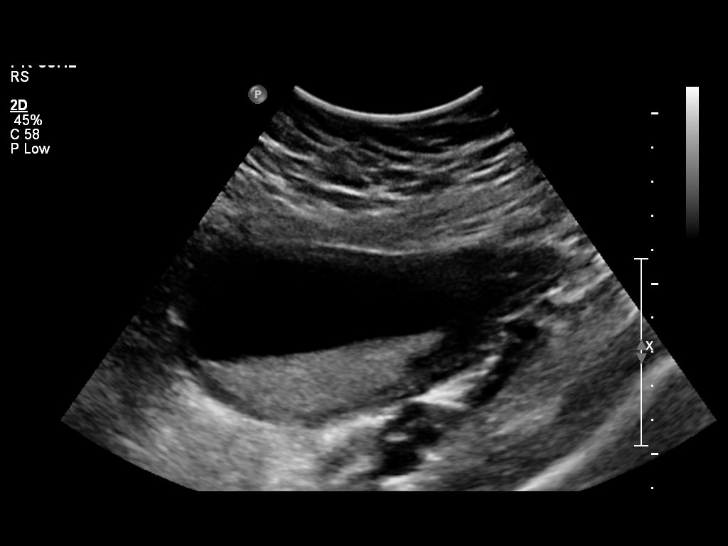
[im 14/96]
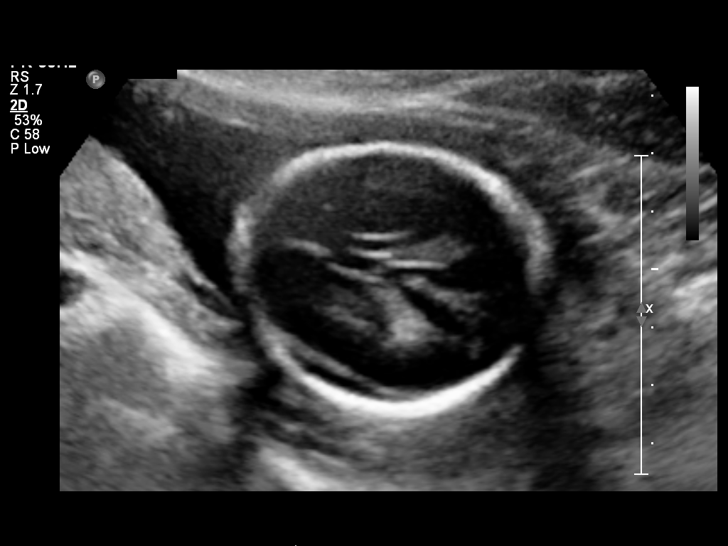
[im 23/96]
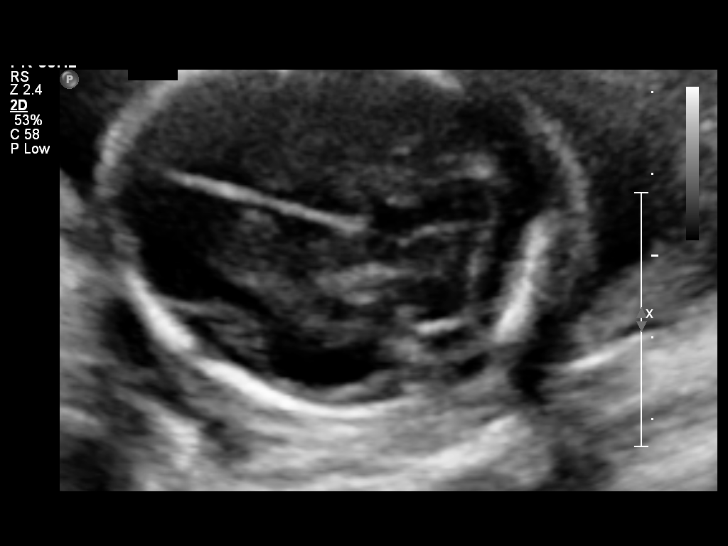
[im 37/96]
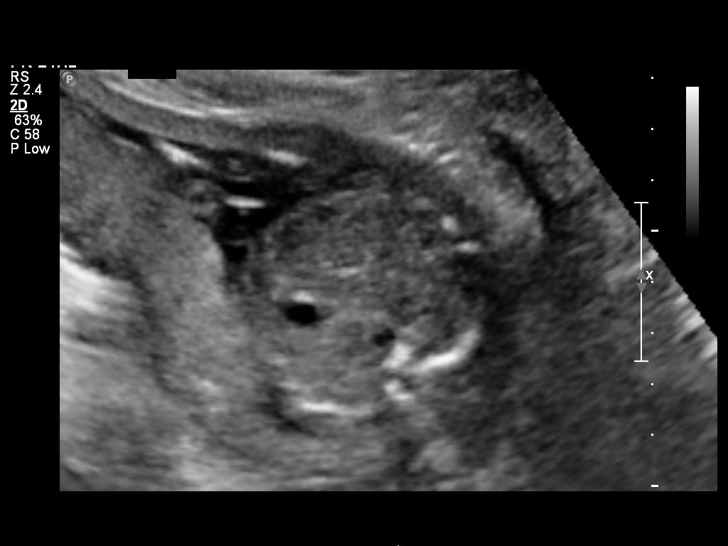
[im 46/96]
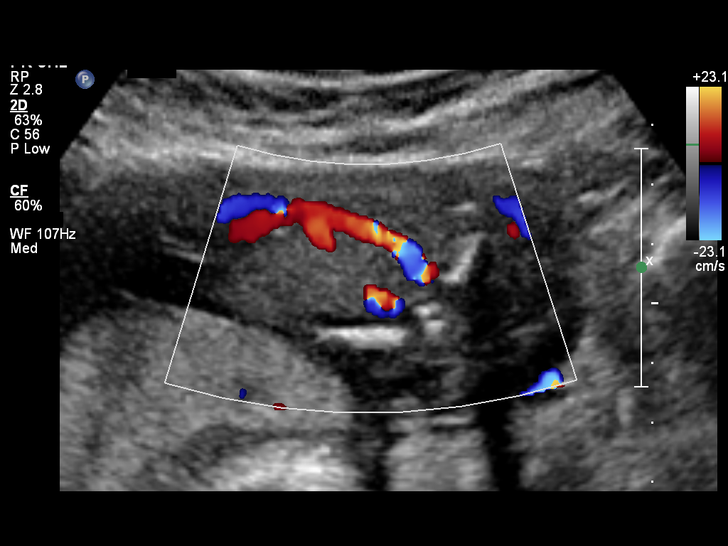
[im 55/96]
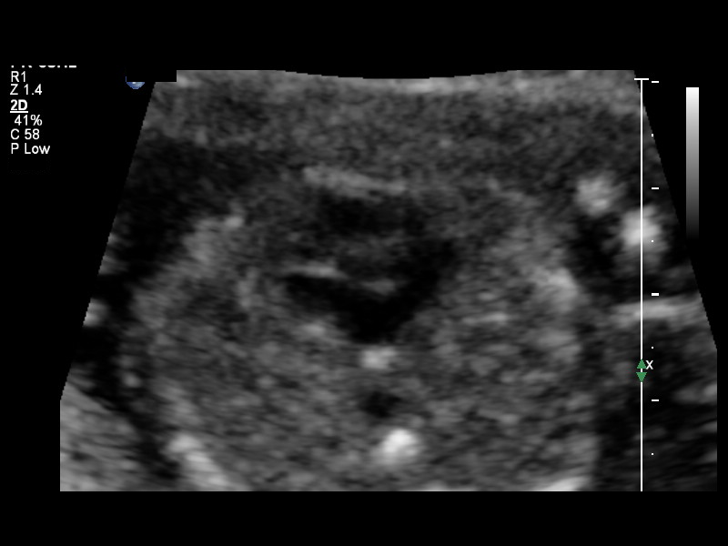
[im 68/96]
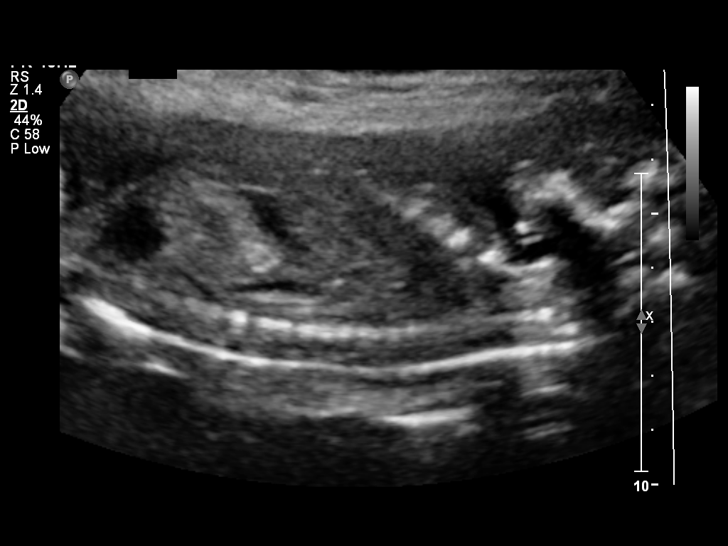
[im 77/96]
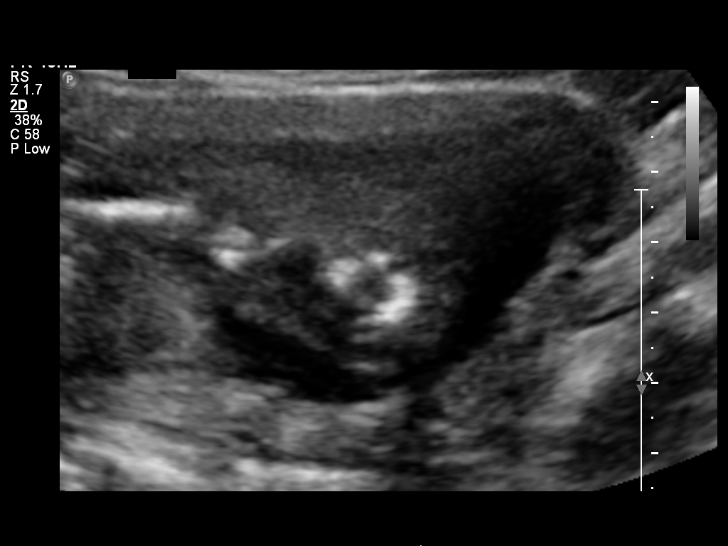
[im 86/96]
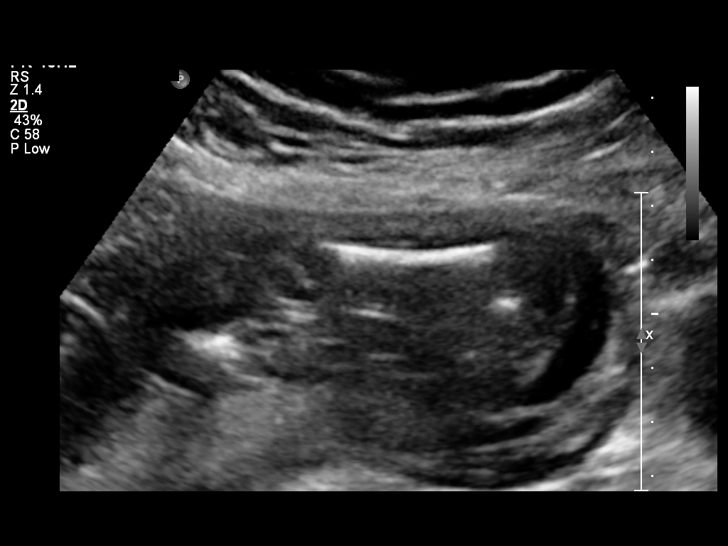

[Series 1: us ob comp +14 wk · 3 of 26 slices shown (2 of 2)]
[im 1/26]
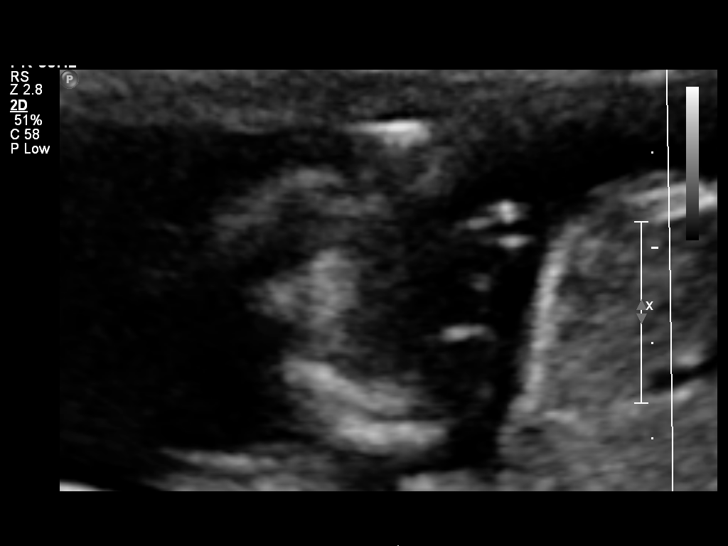
[im 11/26]
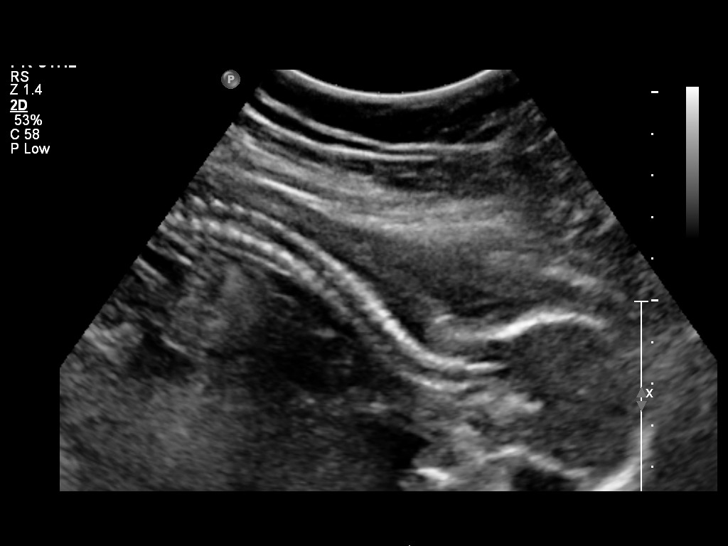
[im 21/26]
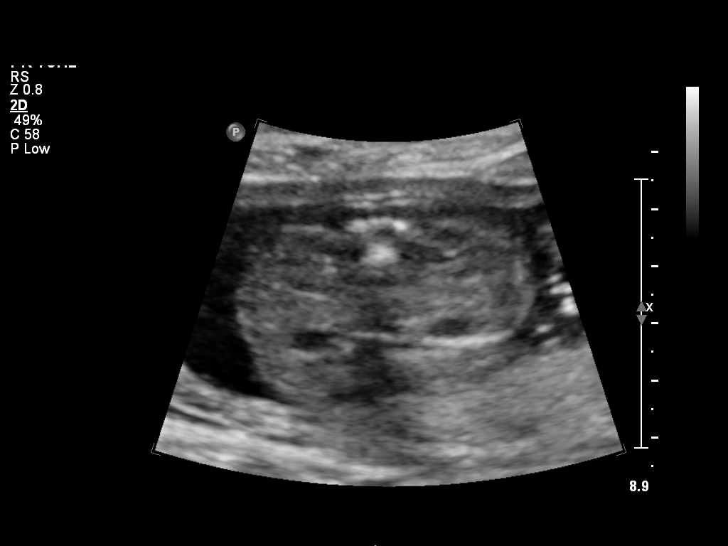

[12 of 28 positions shown; findings below may reference images not displayed]

OBSTETRICS REPORT
                      (Signed Final 02/21/2014 [DATE])

             NII ADOTEY

Service(s) Provided

 US OB COMP + 14 WK                                    76805.1
Indications

 Basic anatomic survey
Fetal Evaluation

 Num Of Fetuses:    1
 Fetal Heart Rate:  143                          bpm
 Cardiac Activity:  Observed
 Presentation:      Cephalic
 Placenta:          Posterior, above cervical
                    os
 P. Cord            Visualized, central
 Insertion:

 Amniotic Fluid
 AFI FV:      Subjectively within normal limits
Biometry

 BPD:     44.8  mm     G. Age:  19w 4d                CI:        73.07   70 - 86
                                                      FL/HC:      18.1   16.1 -

 HC:     166.6  mm     G. Age:  19w 3d       39  %    HC/AC:      1.17   1.09 -

 AC:     142.5  mm     G. Age:  19w 4d       51  %    FL/BPD:
 FL:      30.1  mm     G. Age:  19w 2d       38  %    FL/AC:      21.1   20 - 24
 HUM:     27.4  mm     G. Age:  18w 5d       34  %
 CER:     18.8  mm     G. Age:  18w 3d       21  %
 NFT:     4.74  mm

 Est. FW:     293  gm    0 lb 10 oz      47  %
Gestational Age

 LMP:           19w 3d        Date:  10/08/13                 EDD:   07/15/14
 U/S Today:     19w 3d                                        EDD:   07/15/14
 Best:          19w 3d     Det. By:  LMP  (10/08/13)          EDD:   07/15/14
Anatomy
 Cranium:          Appears normal         Aortic Arch:      Not well visualized
 Fetal Cavum:      Appears normal         Ductal Arch:      Appears normal
 Ventricles:       Appears normal         Diaphragm:        Appears normal
 Choroid Plexus:   Appears normal         Stomach:          Appears normal, left
                                                            sided
 Cerebellum:       Appears normal         Abdomen:          Appears normal
 Posterior Fossa:  Appears normal         Abdominal Wall:   Appears nml (cord
                                                            insert, abd wall)
 Nuchal Fold:      Appears normal         Cord Vessels:     Appears normal (3
                                                            vessel cord)
 Face:             Appears normal         Kidneys:          Appear normal
                   (orbits and profile)
 Lips:             Not well visualized    Bladder:          Appears normal
 Heart:            Appears normal         Spine:            Appears normal
                   (4CH, axis, and
                   situs)
 RVOT:             Not well visualized    Lower             Appears normal
                                          Extremities:
 LVOT:             Appears normal         Upper             Appears normal
                                          Extremities:

 Other:  Fetus appears to be a male. Nasal bone visualized.
Targeted Anatomy

 Fetal Central Nervous System
 Lat. Ventricles:  6.7                    Cisterna Magna:
Cervix Uterus Adnexa

 Cervical Length:    3.41     cm

 Cervix:       Normal appearance by transabdominal scan.
 Uterus:       No abnormality visualized.
 Left Ovary:    Not visualized.
 Right Ovary:   Not visualized.

 Adnexa:     No adnexal mass visualized.
Impression

 SIUP at 19+3 weeks
 Normal detailed fetal anatomy; limited views of lips, RVOT
 and AA; no gross abnormalities
 Markers of aneuploidy: none
 Normal amniotic fluid volume
 Measurements consistent with LMP dating
Recommendations

 Follow-up as clinically indicated; could follow-up to complete
 anatomic survey if desired

 HOIIENG ICONAR ADATAN TRINIDAD with us.  Please do not hesitate to

## 2017-02-09 ENCOUNTER — Encounter (HOSPITAL_COMMUNITY): Payer: Self-pay

## 2017-02-09 ENCOUNTER — Emergency Department (HOSPITAL_COMMUNITY)
Admission: EM | Admit: 2017-02-09 | Discharge: 2017-02-09 | Disposition: A | Discharge status: Home / Self Care | Payer: Medicaid Other | Attending: Emergency Medicine | Admitting: Emergency Medicine

## 2017-02-09 DIAGNOSIS — K0889 Other specified disorders of teeth and supporting structures: Secondary | ICD-10-CM | POA: Diagnosis not present

## 2017-02-09 DIAGNOSIS — Z79899 Other long term (current) drug therapy: Secondary | ICD-10-CM | POA: Insufficient documentation

## 2017-02-09 MED ORDER — AMOXICILLIN 500 MG PO CAPS
500.0000 mg | ORAL_CAPSULE | Freq: Three times a day (TID) | ORAL | 0 refills | Status: AC
Start: 2017-02-09 — End: ?

## 2017-02-09 MED ORDER — TRAMADOL HCL 50 MG PO TABS: 50.0000 mg | ORAL_TABLET | Freq: Four times a day (QID) | ORAL | 0 refills | Status: AC | PRN

## 2017-02-09 MED ORDER — OXYCODONE-ACETAMINOPHEN 5-325 MG PO TABS
2.0000 | ORAL_TABLET | Freq: Once | ORAL | Status: AC
  Administered 2017-02-09: 2 via ORAL
  Filled 2017-02-09: qty 2

## 2017-02-09 NOTE — ED Provider Notes (Signed)
[facility] Provider Note   CSN: [id number] Arrival date & time: 02/09/17  4098     History   Chief Complaint Chief Complaint  Patient presents with  . Dental Pain    HPI Sherri Kane is a 21 y.o. female.  The history is provided by the patient. No language interpreter was used.  Dental Pain   This is a new problem. The current episode started 2 days ago. The problem occurs hourly. The problem has been gradually worsening. The pain is moderate. Treatments tried: ibuprofen and Amoxicillin  x 2 doses. The treatment provided no relief.    Past Medical History:  Diagnosis Date  . Medical history non-contributory     Patient Active Problem List   Diagnosis Date Noted  . Pregnancy 07/03/2014    Past Surgical History:  Procedure Laterality Date  . NO PAST SURGERIES      OB History    Gravida Para Term Preterm AB Living   SAB TAB Ectopic Multiple Live Births           2       Home Medications    Prior to Admission medications   Medication Sig Start Date End Date Taking? Authorizing Provider  amoxicillin (AMOXIL) 500 MG capsule Take 1 capsule (500 mg total) by mouth 3 (three) times daily. 02/09/17   Antony Madura, PA-C  diphenhydrAMINE (BENADRYL) 25 MG tablet Take 50 mg by mouth every 6 (six) hours as needed for allergies.    Historical Provider, MD  ibuprofen (ADVIL,MOTRIN) 600 MG tablet Take 1 tablet (600 mg total) by mouth every 6 (six) hours as needed. 07/05/14   Lesly Dukes, MD  Prenatal Vit-Fe Fumarate-FA (PRENATAL MULTIVITAMIN) TABS tablet Take 1 tablet by mouth daily at 12 noon.    Historical Provider, MD  traMADol (ULTRAM) 50 MG tablet Take 1 tablet (50 mg total) by mouth every 6 (six) hours as needed for severe pain. 02/09/17   Antony Madura, PA-C    Family History History reviewed. No pertinent family history.  Social History Social History  Substance Use Topics  . Smoking status: Never Smoker  . Smokeless tobacco:  Never Used  . Alcohol use No     Allergies   Patient has no known allergies.   Review of Systems Review of Systems Ten systems reviewed and are negative for acute change, except as noted in the HPI.    Physical Exam Updated Vital Signs BP 112/75 (BP Location: Left Arm)   Pulse 68   Temp 98.3 F (36.8 C) (Oral)   Resp 16   SpO2 100%   Physical Exam  Constitutional: She is oriented to person, place, and time. She appears well-developed and well-nourished. No distress.  Nontoxic appearing and in no acute distress  HENT:  Head: Normocephalic and atraumatic.  Mouth/Throat: Oropharynx is clear and moist. No trismus in the jaw. Dental caries present. No dental abscesses.    Cracked filling to molar. No gingival swelling or fluctuance. No trismus. Uvula midline. Patient tolerating secretions without difficulty.  Eyes: Conjunctivae and EOM are normal. No scleral icterus.  Neck: Normal range of motion.  No nuchal rigidity or meningismus  Pulmonary/Chest: Effort normal. No respiratory distress. She has no wheezes.  Respirations even and unlabored  Musculoskeletal: Normal range of motion.  Neurological: She is alert and oriented to person, place, and time.  Skin: Skin is warm and dry. No rash noted. She is not  diaphoretic. No erythema. No pallor.  Psychiatric: She has a normal mood and affect. Her behavior is normal.  Nursing note and vitals reviewed.    ED Treatments / Results  Labs (all labs ordered are listed, but only abnormal results are displayed) Labs Reviewed - No data to display  EKG  EKG Interpretation None       Radiology No results found.  Procedures Procedures (including critical care time)  Medications Ordered in ED Medications  oxyCODONE-acetaminophen (PERCOCET/ROXICET) 5-325 MG per tablet 2 tablet (not administered)     Initial Impression / Assessment and Plan / ED Course  I have reviewed the triage vital signs and the nursing  notes.  Pertinent labs & imaging results that were available during my care of the patient were reviewed by me and considered in my medical decision making (see chart for details).     Patient with toothache. No gross abscess. Exam unconcerning for Ludwig's angina or spread of infection. Will treat with Amoxicillin and pain medicine. Urged patient to follow-up with dentist. Return precautions discussed and provided. Patient discharged in stable condition with no unaddressed concerns.   Final Clinical Impressions(s) / ED Diagnoses   Final diagnoses:  Dentalgia    New Prescriptions New Prescriptions   AMOXICILLIN (AMOXIL) 500 MG CAPSULE    Take 1 capsule (500 mg total) by mouth 3 (three) times daily.   TRAMADOL (ULTRAM) 50 MG TABLET    Take 1 tablet (50 mg total) by mouth every 6 (six) hours as needed for severe pain.     Antony Madura, PA-C 02/09/17 1610    Zadie Rhine, MD 02/10/17 [phone]

## 2017-02-09 NOTE — Discharge Instructions (Signed)
We recommend you continue taking amoxicillin as prescribed. Take 600 mg of ibuprofen and 500 mg Tylenol every 6 hours for pain control. If you continue to experience pain, you may take tramadol as needed. Follow-up with your dentist regarding your visit today.

## 2017-02-09 NOTE — ED Triage Notes (Signed)
Pt here for toothache, onset Thursday. sts no relief with meds at home.

## 2019-10-28 ENCOUNTER — Ambulatory Visit
Admission: EM | Admit: 2019-10-28 | Discharge: 2019-10-28 | Disposition: A | Discharge status: Home / Self Care | Payer: Medicaid Other

## 2019-12-08 ENCOUNTER — Ambulatory Visit: Payer: Medicaid Other | Attending: Internal Medicine

## 2019-12-08 DIAGNOSIS — Z20822 Contact with and (suspected) exposure to covid-19: Secondary | ICD-10-CM

## 2019-12-09 LAB — NOVEL CORONAVIRUS, NAA: SARS-CoV-2, NAA: NOT DETECTED

## 2022-09-09 DIAGNOSIS — F3181 Bipolar II disorder: Secondary | ICD-10-CM | POA: Diagnosis not present

## 2022-09-09 DIAGNOSIS — Z23 Encounter for immunization: Secondary | ICD-10-CM | POA: Diagnosis not present

## 2022-11-27 DIAGNOSIS — F3181 Bipolar II disorder: Secondary | ICD-10-CM | POA: Diagnosis not present

## 2022-11-27 DIAGNOSIS — F900 Attention-deficit hyperactivity disorder, predominantly inattentive type: Secondary | ICD-10-CM | POA: Diagnosis not present

## 2022-12-18 DIAGNOSIS — F900 Attention-deficit hyperactivity disorder, predominantly inattentive type: Secondary | ICD-10-CM | POA: Diagnosis not present

## 2022-12-18 DIAGNOSIS — F3181 Bipolar II disorder: Secondary | ICD-10-CM | POA: Diagnosis not present

## 2022-12-25 ENCOUNTER — Ambulatory Visit
Admission: EM | Admit: 2022-12-25 | Discharge: 2022-12-25 | Disposition: A | Discharge status: Home / Self Care | Payer: Medicaid Other | Attending: Internal Medicine | Admitting: Internal Medicine

## 2022-12-25 DIAGNOSIS — J029 Acute pharyngitis, unspecified: Secondary | ICD-10-CM | POA: Diagnosis not present

## 2022-12-25 DIAGNOSIS — Z20822 Contact with and (suspected) exposure to covid-19: Secondary | ICD-10-CM | POA: Insufficient documentation

## 2022-12-25 LAB — POCT MONO SCREEN (KUC): Mono, POC: NEGATIVE

## 2022-12-25 LAB — POCT RAPID STREP A (OFFICE): Rapid Strep A Screen: NEGATIVE

## 2022-12-25 MED ORDER — CHLORASEPTIC 1.4 % MT LIQD: 1.0000 | OROMUCOSAL | 0 refills | Status: AC | PRN

## 2022-12-25 NOTE — Discharge Instructions (Signed)
Rapid strep and rapid mono were negative.  Throat culture and COVID test are pending.  Will call if they are abnormal.  I have prescribed a throat spray to help alleviate discomfort. Ensure adequate fluid hydration and rest.  Follow-up if any symptoms persist or worsen.

## 2022-12-25 NOTE — ED Triage Notes (Signed)
Pt c/o body aches, headaches, sore throat,   Onset ~ Monday

## 2022-12-25 NOTE — ED Provider Notes (Signed)
[facility]    CSN: [id number] Arrival date & time: 12/25/22  1020      History   Chief Complaint Chief Complaint  Patient presents with   Sore Throat    HPI Sherri Kane is a 27 y.o. female.   Patient presents with sore throat, lymph node swelling, runny nose, headache, generalized bodyaches that started about 2 to 3 days ago.  Patient denies any known sick contacts or fever at home.  Reports that she felt feverish but did not take temperature with thermometer.  Denies cough, ear pain, nausea, vomiting, diarrhea, abdominal pain.  She has not taken any medications to alleviate symptoms.   Sore Throat    Past Medical History:  Diagnosis Date   Medical history non-contributory     Patient Active Problem List   Diagnosis Date Noted   Pregnancy 07/03/2014    Past Surgical History:  Procedure Laterality Date   NO PAST SURGERIES      OB History     Gravida  3   Para  2   Term  2   Preterm      AB      Living  2      SAB      IAB      Ectopic      Multiple      Live Births  2            Home Medications    Prior to Admission medications   Medication Sig Start Date End Date Taking? Authorizing Provider  amphetamine-dextroamphetamine (ADDERALL) 10 MG tablet Take by mouth. 11/27/22  Yes [provider]  ARIPiprazole (ABILIFY) 2 MG tablet Take by mouth. 09/09/22  Yes [provider]  lamoTRIgine (LAMICTAL) 150 MG tablet Take by mouth. 09/03/22  Yes [provider]  phenol (CHLORASEPTIC) 1.4 % LIQD Use as directed 1 spray in the mouth or throat as needed for throat irritation / pain. 12/25/22  Yes [place] Mallon, Hildred Alamin E, FNP  amoxicillin (AMOXIL) 500 MG capsule Take 1 capsule (500 mg total) by mouth 3 (three) times daily. 02/09/17   Antonietta Breach, PA-C  diphenhydrAMINE (BENADRYL) 25 MG tablet Take 50 mg by mouth every 6 (six) hours as needed for allergies.    [provider]  ibuprofen  (ADVIL,MOTRIN) 600 MG tablet Take 1 tablet (600 mg total) by mouth every 6 (six) hours as needed. 07/05/14   Guss Bunde, MD  levonorgestrel (MIRENA) 20 MCG/DAY IUD by Intrauterine route.    [provider]  Prenatal Vit-Fe Fumarate-FA (PRENATAL MULTIVITAMIN) TABS tablet Take 1 tablet by mouth daily at 12 noon.    [provider]  traMADol (ULTRAM) 50 MG tablet Take 1 tablet (50 mg total) by mouth every 6 (six) hours as needed for severe pain. 02/09/17   Antonietta Breach, PA-C    Family History History reviewed. No pertinent family history.  Social History Social History   Tobacco Use   Smoking status: Never   Smokeless tobacco: Never  Substance Use Topics   Alcohol use: No   Drug use: No     Allergies   Patient has no known allergies.   Review of Systems Review of Systems Per HPI  Physical Exam Triage Vital Signs ED Triage Vitals [12/25/22 1027]  Enc Vitals Group     BP 115/78     Pulse Rate 99     Resp 16     Temp 99.1 F (37.3 C)  Temp Source Oral     SpO2 98 %     Weight      Height      Head Circumference      Peak Flow      Pain Score 10     Pain Loc      Pain Edu?      Excl. in [place]?    No data found.  Updated Vital Signs BP 115/78 (BP Location: Left Arm)   Pulse 99   Temp 99.1 F (37.3 C) (Oral)   Resp 16   SpO2 98%   Breastfeeding No   Visual Acuity Right Eye Distance:   Left Eye Distance:   Bilateral Distance:    Right Eye Near:   Left Eye Near:    Bilateral Near:     Physical Exam Constitutional:      General: She is not in acute distress.    Appearance: Normal appearance. She is not toxic-appearing or diaphoretic.  HENT:     Head: Normocephalic and atraumatic.     Right Ear: Tympanic membrane and ear canal normal.     Left Ear: Tympanic membrane and ear canal normal.     Nose: Congestion present.     Mouth/Throat:     Mouth: Mucous membranes are moist.     Pharynx: Posterior oropharyngeal erythema present.   Eyes:     Extraocular Movements: Extraocular movements intact.     Conjunctiva/sclera: Conjunctivae normal.     Pupils: Pupils are equal, round, and reactive to light.  Cardiovascular:     Rate and Rhythm: Normal rate and regular rhythm.     Pulses: Normal pulses.     Heart sounds: Normal heart sounds.  Pulmonary:     Effort: Pulmonary effort is normal. No respiratory distress.     Breath sounds: Normal breath sounds. No wheezing.  Abdominal:     General: Abdomen is flat. Bowel sounds are normal.     Palpations: Abdomen is soft.  Musculoskeletal:        General: Normal range of motion.     Cervical back: Normal range of motion.  Lymphadenopathy:     Cervical: Cervical adenopathy present.     Right cervical: Superficial cervical adenopathy present.     Left cervical: Superficial cervical adenopathy present.  Skin:    General: Skin is warm and dry.  Neurological:     General: No focal deficit present.     Mental Status: She is alert and oriented to person, place, and time. Mental status is at baseline.  Psychiatric:        Mood and Affect: Mood normal.        Behavior: Behavior normal.      UC Treatments / Results  Labs (all labs ordered are listed, but only abnormal results are displayed) Labs Reviewed  CULTURE, GROUP A STREP ([place])  SARS CORONAVIRUS 2 (TAT 6-24 HRS)  POCT RAPID STREP A (OFFICE)  POCT MONO SCREEN ([facility])    EKG   Radiology No results found.  Procedures Procedures (including critical care time)  Medications Ordered in UC Medications - No data to display  Initial Impression / Assessment and Plan / UC Course  I have reviewed the triage vital signs and the nursing notes.  Pertinent labs & imaging results that were available during my care of the patient were reviewed by me and considered in my medical decision making (see chart for details).     Rapid strep and rapid mono were negative.  Throat culture and COVID test pending.  She has very  minimal cervical lymph node swelling which is most likely due to viral illness given associated symptoms.  Suspect viral pharyngitis.  Lymph node swelling should resolve once symptoms resolve.  Advised strict return precautions and supportive care.  Patient verbalized understanding and was agreeable with plan. Final Clinical Impressions(s) / UC Diagnoses   Final diagnoses:  Sore throat  Viral pharyngitis  Encounter for laboratory testing for COVID-19 virus     Discharge Instructions      Rapid strep and rapid mono were negative.  Throat culture and COVID test are pending.  Will call if they are abnormal.  I have prescribed a throat spray to help alleviate discomfort. Ensure adequate fluid hydration and rest.  Follow-up if any symptoms persist or worsen.     ED Prescriptions     Medication Sig Dispense Auth. Provider   phenol (CHLORASEPTIC) 1.4 % LIQD Use as directed 1 spray in the mouth or throat as needed for throat irritation / pain. 118 mL Teodora Medici, [place]      PDMP not reviewed this encounter.   Teodora Medici, [place] 12/25/22 1121

## 2022-12-26 LAB — CULTURE, GROUP A STREP (THRC)

## 2022-12-27 LAB — CULTURE, GROUP A STREP (THRC)

## 2022-12-30 ENCOUNTER — Telehealth (HOSPITAL_COMMUNITY): Payer: Self-pay | Admitting: Emergency Medicine

## 2022-12-30 NOTE — Telephone Encounter (Signed)
Notified by lab they will be unable to run patient's COVID test.   Attempted to reach patient x 1, LVM

## 2023-01-01 DIAGNOSIS — F900 Attention-deficit hyperactivity disorder, predominantly inattentive type: Secondary | ICD-10-CM | POA: Diagnosis not present

## 2023-01-01 DIAGNOSIS — F3181 Bipolar II disorder: Secondary | ICD-10-CM | POA: Diagnosis not present

## 2023-01-01 DIAGNOSIS — N898 Other specified noninflammatory disorders of vagina: Secondary | ICD-10-CM | POA: Diagnosis not present

## 2024-12-09 ENCOUNTER — Encounter: Admitting: Obstetrics and Gynecology
